# Patient Record
Sex: Male | Born: 1995 | Race: White | Hispanic: No | Marital: Single | Smoking: Former smoker
Health system: Southern US, Community
[De-identification: ages and names within clinical notes are randomized; demographics above are authoritative.]

---
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
## 2017-04-03 ENCOUNTER — Emergency Department (HOSPITAL_BASED_OUTPATIENT_CLINIC_OR_DEPARTMENT_OTHER)
Admission: EM | Admit: 2017-04-03 | Discharge: 2017-04-03 | Disposition: A | Discharge status: Home / Self Care | Payer: Managed Care, Other (non HMO) | Attending: Emergency Medicine | Admitting: Emergency Medicine

## 2017-04-03 ENCOUNTER — Encounter (HOSPITAL_BASED_OUTPATIENT_CLINIC_OR_DEPARTMENT_OTHER): Payer: Self-pay | Admitting: Emergency Medicine

## 2017-04-03 ENCOUNTER — Emergency Department (HOSPITAL_BASED_OUTPATIENT_CLINIC_OR_DEPARTMENT_OTHER): Payer: Managed Care, Other (non HMO)

## 2017-04-03 DIAGNOSIS — Y929 Unspecified place or not applicable: Secondary | ICD-10-CM | POA: Diagnosis not present

## 2017-04-03 DIAGNOSIS — Z79899 Other long term (current) drug therapy: Secondary | ICD-10-CM | POA: Insufficient documentation

## 2017-04-03 DIAGNOSIS — W260XXA Contact with knife, initial encounter: Secondary | ICD-10-CM | POA: Insufficient documentation

## 2017-04-03 DIAGNOSIS — Y999 Unspecified external cause status: Secondary | ICD-10-CM | POA: Insufficient documentation

## 2017-04-03 DIAGNOSIS — Z87891 Personal history of nicotine dependence: Secondary | ICD-10-CM | POA: Insufficient documentation

## 2017-04-03 DIAGNOSIS — Y9389 Activity, other specified: Secondary | ICD-10-CM | POA: Insufficient documentation

## 2017-04-03 DIAGNOSIS — S61412A Laceration without foreign body of left hand, initial encounter: Secondary | ICD-10-CM | POA: Diagnosis not present

## 2017-04-03 DIAGNOSIS — S6982XA Other specified injuries of left wrist, hand and finger(s), initial encounter: Secondary | ICD-10-CM | POA: Diagnosis present

## 2017-04-03 MED ORDER — LIDOCAINE-EPINEPHRINE-TETRACAINE (LET) SOLUTION
3.0000 mL | Freq: Once | NASAL | Status: AC
  Administered 2017-04-03: 3 mL via TOPICAL
  Filled 2017-04-03: qty 3

## 2017-04-03 MED ORDER — LIDOCAINE HCL 1 % IJ SOLN
INTRAMUSCULAR | Status: AC
  Administered 2017-04-03: 20 mL
  Filled 2017-04-03: qty 20

## 2017-04-03 MED ORDER — LIDOCAINE HCL (PF) 1 % IJ SOLN: 30.0000 mL | Freq: Once | INTRAMUSCULAR | Status: DC

## 2017-04-03 NOTE — ED Triage Notes (Signed)
Laceration to left hand by a machete while cleaning brush out of the road this afternoon.

## 2017-04-03 NOTE — ED Provider Notes (Signed)
[facility] Provider [facility]   CSN: [id number] Arrival date & time: 04/03/17  1743     History   Chief Complaint Chief Complaint  Patient presents with  . Extremity Laceration    HPI Colin Conner is a 21 y.o. male.  HPI 21 year old Caucasian male presents to the ED with complaints of laceration to the left hand. Patient states that he was trying to move a tree out of the road today and he used his machete. States a machete hit his left hand and caused a laceration. Patient states his tetanus shot is up-to-date. Denies any associated paresthesias or weakness. Denies any pain. Bleeding controlled. History reviewed. No pertinent past medical history.  There are no active problems to display for this patient.   History reviewed. No pertinent surgical history.     Home Medications    Prior to Admission medications   Medication Sig Start Date End Date Taking? Authorizing Provider  bacitracin ointment Apply 1 application topically 2 (two) times daily. 10/21/16   Everlene Farrier, PA-C  naproxen (NAPROSYN) 250 MG tablet Take 1 tablet (250 mg total) by mouth 2 (two) times daily with a meal. As needed for pain. 10/21/16   Everlene Farrier, PA-C    Family History No family history on file.  Social History Social History  Substance Use Topics  . Smoking status: Former Games developer  . Smokeless tobacco: Never Used  . Alcohol use No     Allergies   Patient has no known allergies.   Review of Systems Review of Systems  Musculoskeletal: Positive for myalgias.  Skin: Positive for wound.  Neurological: Negative for weakness and numbness.     Physical Exam Updated Vital Signs BP 138/72 (BP Location: Left Arm)   Pulse (!) 57   Temp 97.8 F (36.6 C) (Oral)   Resp 16   Ht  (1.88 m)   Wt 65.8 kg (145 lb)   SpO2 100%   BMI 18.62 kg/m   Physical Exam  Constitutional: He appears well-developed and well-nourished. No distress.  HENT:  Head: Normocephalic and  atraumatic.  Eyes: Right eye exhibits no discharge. Left eye exhibits no discharge. No scleral icterus.  Neck: Normal range of motion.  Pulmonary/Chest: No respiratory distress.  Musculoskeletal: Normal range of motion.       Hands: 3 similar laceration to the left thenar eminence of the left hand. Bleeding is controlled.No skin debris noted. Full range of motion the left wrist. Good opposition, abduction, abduction of the left thumb. Patient's hand is cool to touch however good cap refill. Patient states this is baseline for him. Sensation intact in all dermatomes. Good strength in all phalanges.  Neurological: He is alert.  Skin: No pallor.  Psychiatric: His behavior is normal. Judgment and thought content normal.  Nursing note and vitals reviewed.    ED Treatments / Results  Labs (all labs ordered are listed, but only abnormal results are displayed) Labs Reviewed - No data to display  EKG  EKG Interpretation None       Radiology Dg Hand Complete Left  Result Date: 04/03/2017 CLINICAL DATA:  21 year old male status post laceration when cutting down trees. EXAM: LEFT HAND - COMPLETE 3+ VIEW COMPARISON:  None. FINDINGS: Dressing material about the carpometacarpal junction level. Skeletally mature. Bone mineralization is within normal limits. Distal radius and ulna appear intact. Carpal bone alignment and joint spaces are normal. Metacarpals intact. Phalanges intact and normally aligned. No radiopaque foreign body identified. No subcutaneous gas. IMPRESSION:  Negative. Electronically Signed   By: Odessa Fleming M.D.   On: 04/03/2017 18:44    Procedures .Marland KitchenLaceration Repair Date/Time: 04/03/2017 7:27 PM Performed by: Rise Mu Authorized by: Demetrios Loll T   Consent:    Consent obtained:  Verbal   Consent given by:  Patient   Risks discussed:  Infection, need for additional repair, nerve damage, poor wound healing, poor cosmetic result, retained foreign body, tendon  damage, vascular damage and pain   Alternatives discussed:  No treatment Anesthesia (see MAR for exact dosages):    Anesthesia method:  Local infiltration and topical application   Topical anesthetic:  LET   Local anesthetic:  Lidocaine 1% w/o epi Laceration details:    Location:  Hand   Hand location:  L palm   Length (cm):  3   Depth (mm):  10 Pre-procedure details:    Preparation:  Patient was prepped and draped in usual sterile fashion and imaging obtained to evaluate for foreign bodies Exploration:    Hemostasis achieved with:  Direct pressure and LET   Wound exploration: wound explored through full range of motion and entire depth of wound probed and visualized     Wound extent: no foreign bodies/material noted and no underlying fracture noted     Contaminated: no   Treatment:    Area cleansed with:  Saline and Betadine   Amount of cleaning:  Extensive   Irrigation solution:  Sterile water   Irrigation volume:  100   Irrigation method:  Pressure wash   Visualized foreign bodies/material removed: no   Skin repair:    Repair method:  Sutures   Suture size:  4-0   Suture material:  Prolene   Suture technique:  Simple interrupted   Number of sutures:  9 Approximation:    Approximation:  Close   Vermilion border: well-aligned   Post-procedure details:    Dressing:  Bulky dressing and splint for protection   Patient tolerance of procedure:  Tolerated well, no immediate complications   (including critical care time)  Medications Ordered in ED Medications  lidocaine-EPINEPHrine-tetracaine (LET) solution (not administered)  lidocaine (PF) (XYLOCAINE) 1 % injection 30 mL (not administered)     Initial Impression / Assessment and Plan / ED Course  I have reviewed the triage vital signs and the nursing notes.  Pertinent labs & imaging results that were available during my care of the patient were reviewed by me and considered in my medical decision making (see chart for  details).     Patient presents with laceration to left hand from machete. Wound is clean without any significant debris. Patient is neurovascularly intact. Patient's hand is cold however this is baseline per patient and bilatearlly. Cap refill is normal. Tdap utd.Pressure irrigation performed. Laceration occurred < 8 hours prior to repair which was well tolerated. Pt has no co morbidities to effect normal wound healing. X-ray shows no foreign body. Wound was extensively cleaned. Discussed suture home care w pt and answered questions. Patient placed in thumb spica to prevent mobilization of the thumb. Pt to f-u for wound check and suture removal in 7 days. Pt is hemodynamically stable w no complaints prior to dc.     Final Clinical Impressions(s) / ED Diagnoses   Final diagnoses:  Laceration of left hand without foreign body, initial encounter    New Prescriptions New Prescriptions   No medications on file     Wallace Keller 04/03/17 1920    Rise Mu,  PA-C 04/03/17 1921    Rise Mu, PA-C 04/03/17 Edsel Petrin, MD 04/04/17 [phone]

## 2017-04-03 NOTE — Discharge Instructions (Signed)
WOUND CARE °Please have your stitches/staples removed in 7-10 days or sooner if you have concerns. You may do this at any available urgent care or at your primary care doctor's office. ° Keep area clean and dry for 24 hours. Do not remove °bandage, if applied. ° After 24 hours, remove bandage and wash wound °gently with mild soap and warm water. Reapply °a new bandage after cleaning wound, if directed. ° Continue daily cleansing with soap and water until °stitches/staples are removed. ° Do not apply any ointments or creams to the wound °while stitches/staples are in place, as this may cause °delayed healing. ° Seek medical careif you experience any of the following °signs of infection: Swelling, redness, pus drainage, °streaking, fever >101.0 F ° Seek care if you experience excessive bleeding °that does not stop after 15-20 minutes of constant, firm °pressure. °  °

## 2017-04-03 NOTE — ED Notes (Signed)
ED Provider at bedside. 

## 2018-01-13 IMAGING — CR DG TIBIA/FIBULA 2V*L*
2 series · 2 of 2 positions shown · non-contrast
Comparison: None.

CLINICAL DATA: Trauma to the anterior left tibia region.

EXAM:
LEFT TIBIA AND FIBULA - 2 VIEW

[x tib-fib ap left]
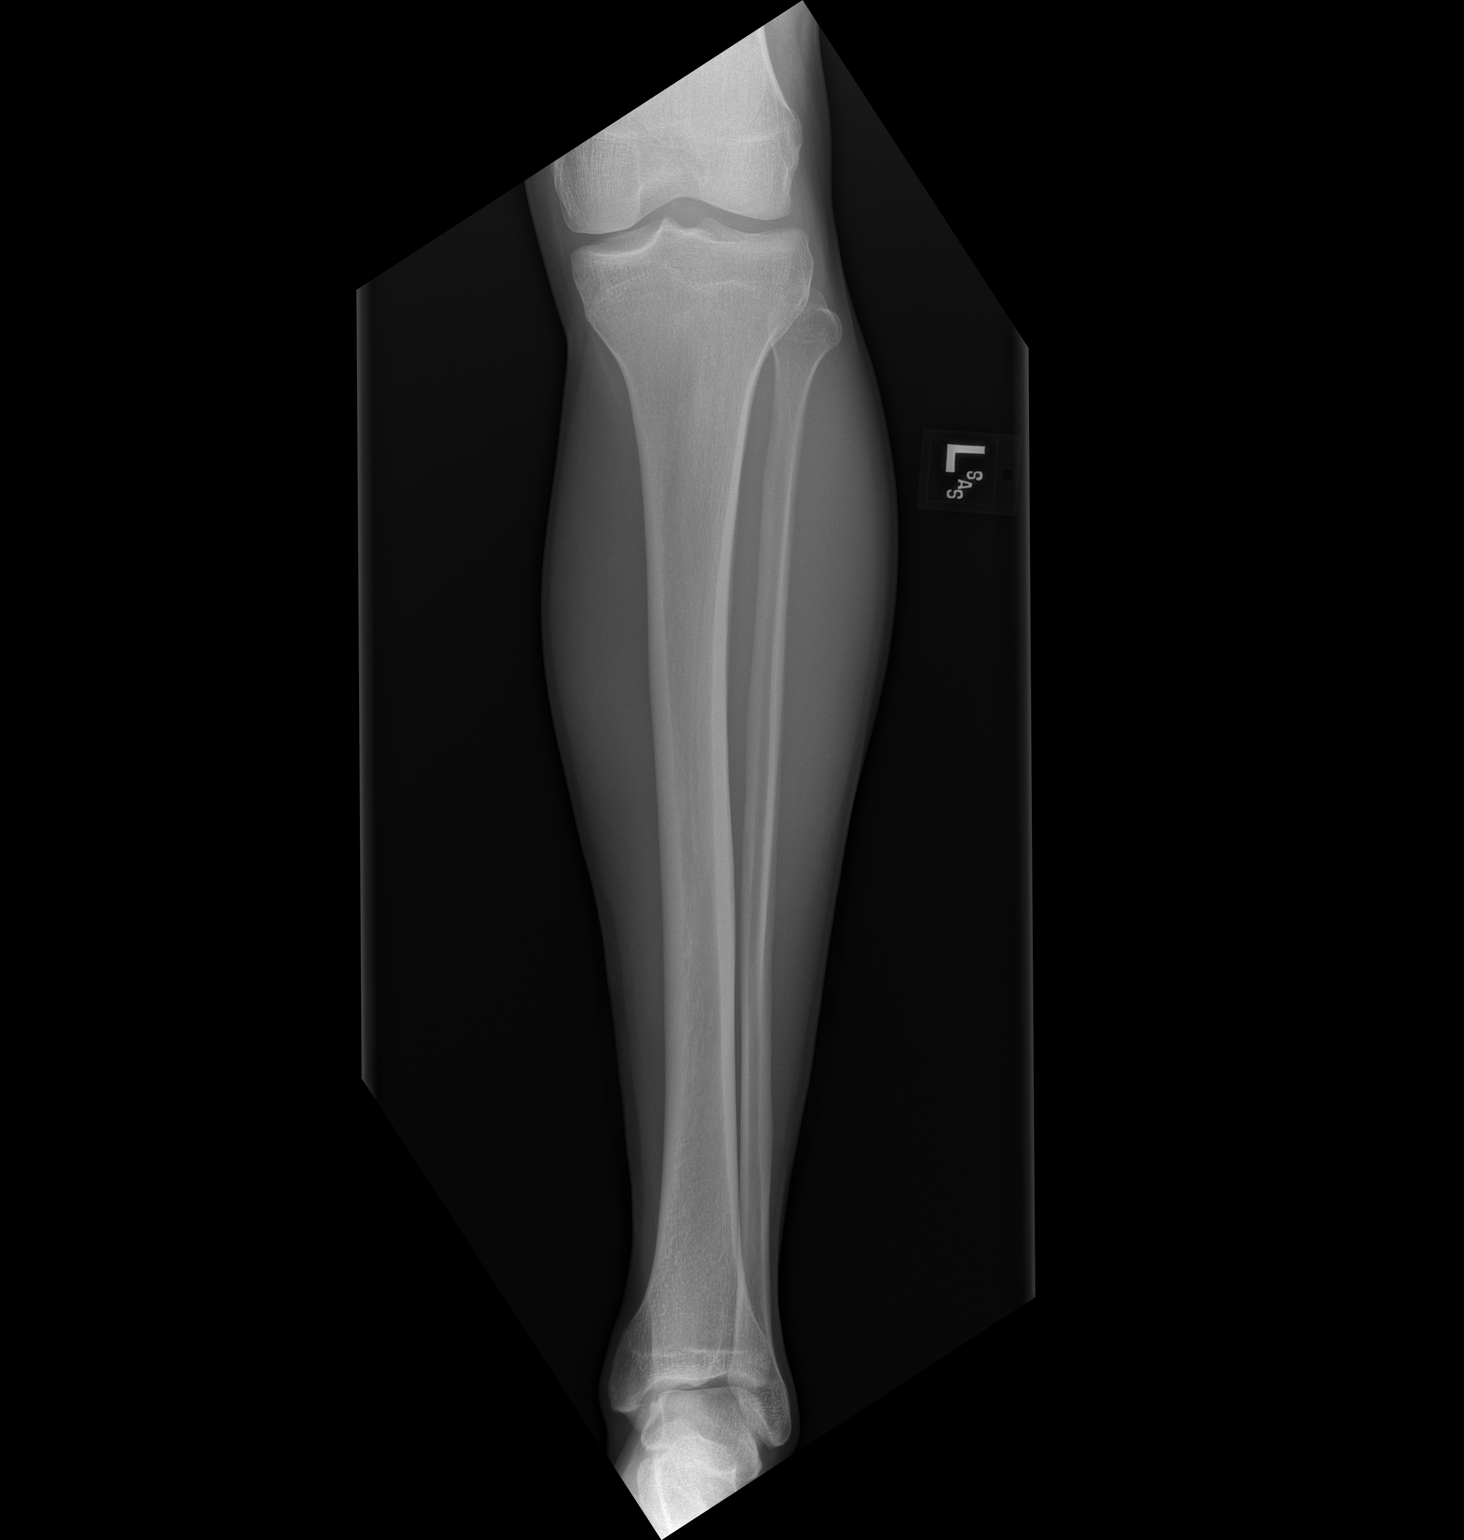

[x tib-fib lat left]
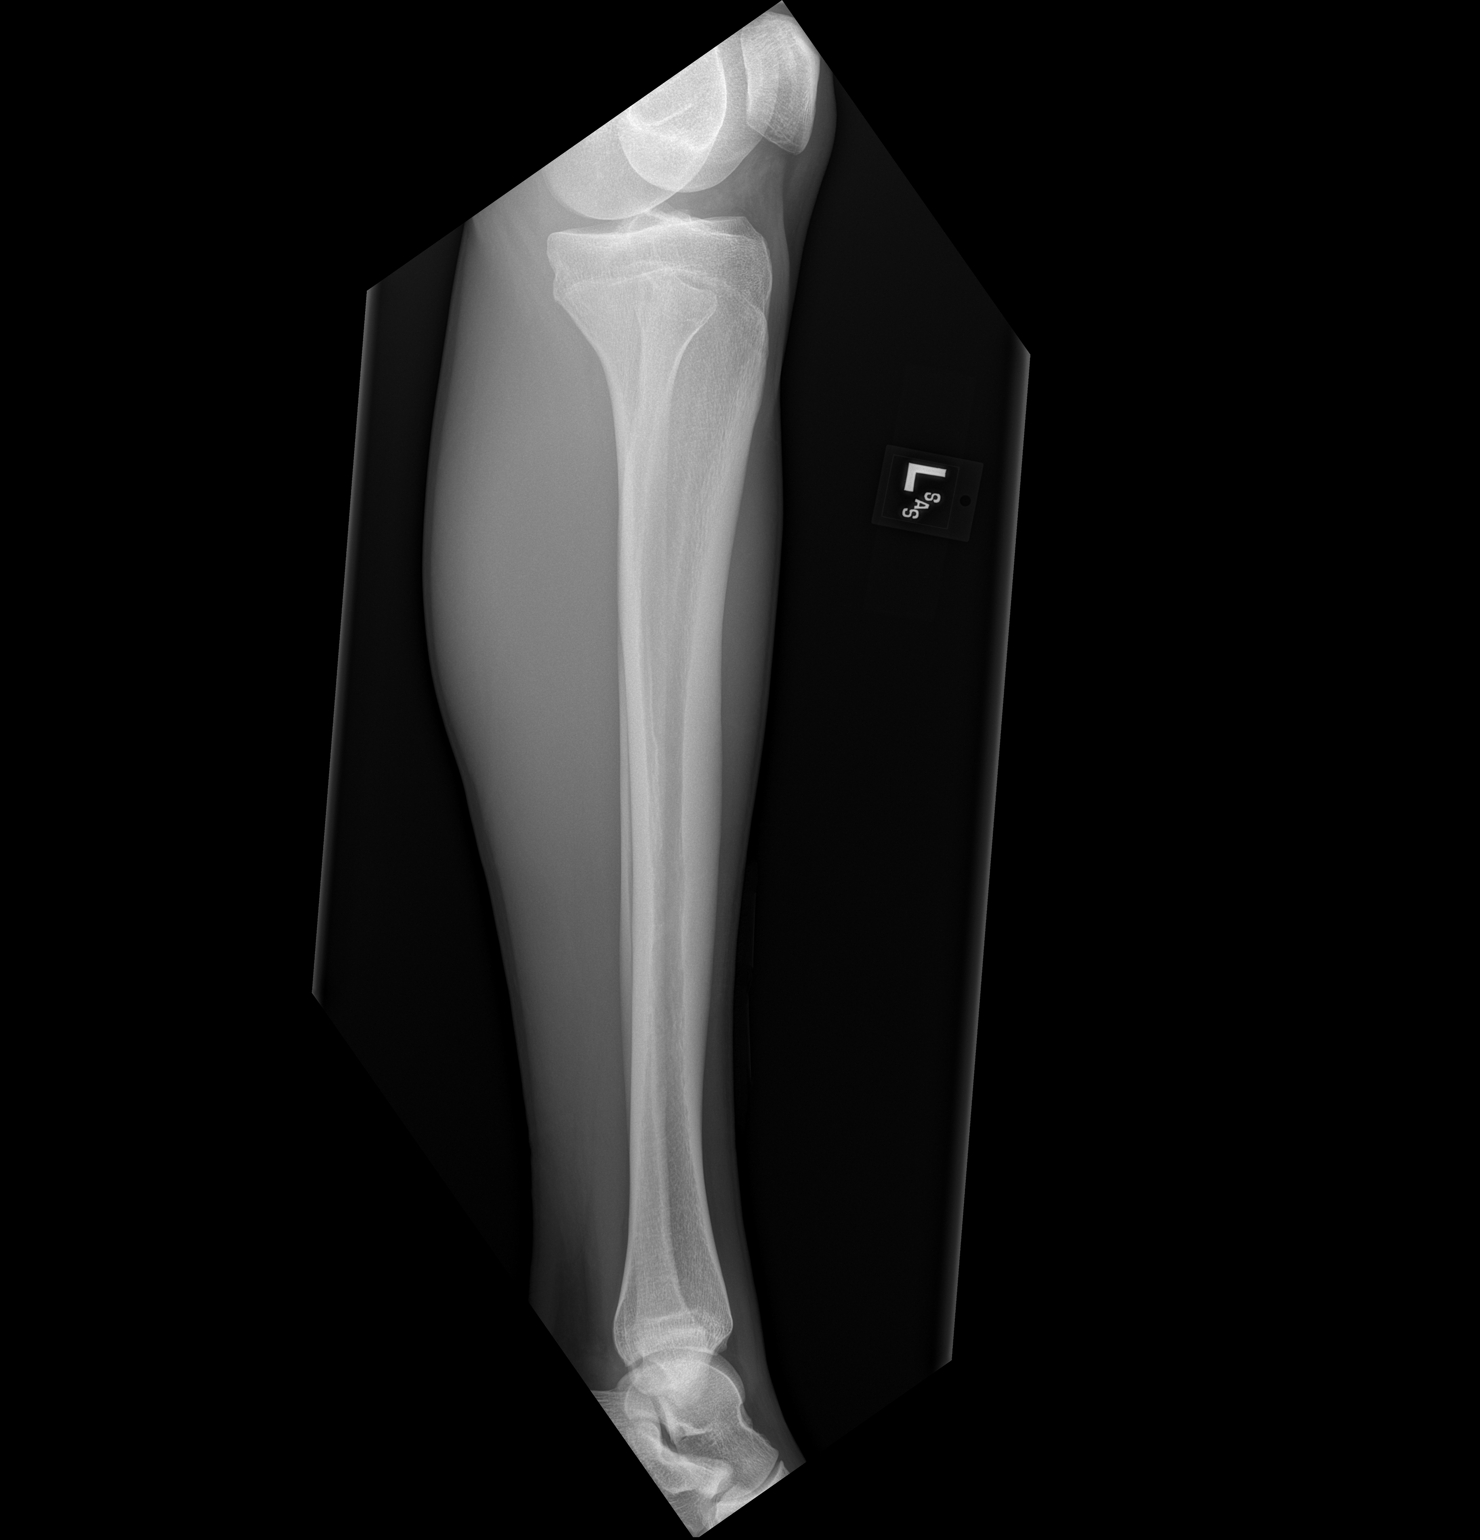

[2 of 2 positions shown; findings below may reference images not displayed]

FINDINGS: There is no evidence of fracture or other focal bone lesions. Soft
tissues are unremarkable.
IMPRESSION: No fracture.  No radiopaque foreign body.

## 2018-06-26 IMAGING — CR DG HAND COMPLETE 3+V*L*
3 series · 3 of 3 positions shown · non-contrast
Comparison: None.

CLINICAL DATA: 21-year-old male status post laceration when cutting
down trees.

EXAM:
LEFT HAND - COMPLETE 3+ VIEW

[x hand pa left]
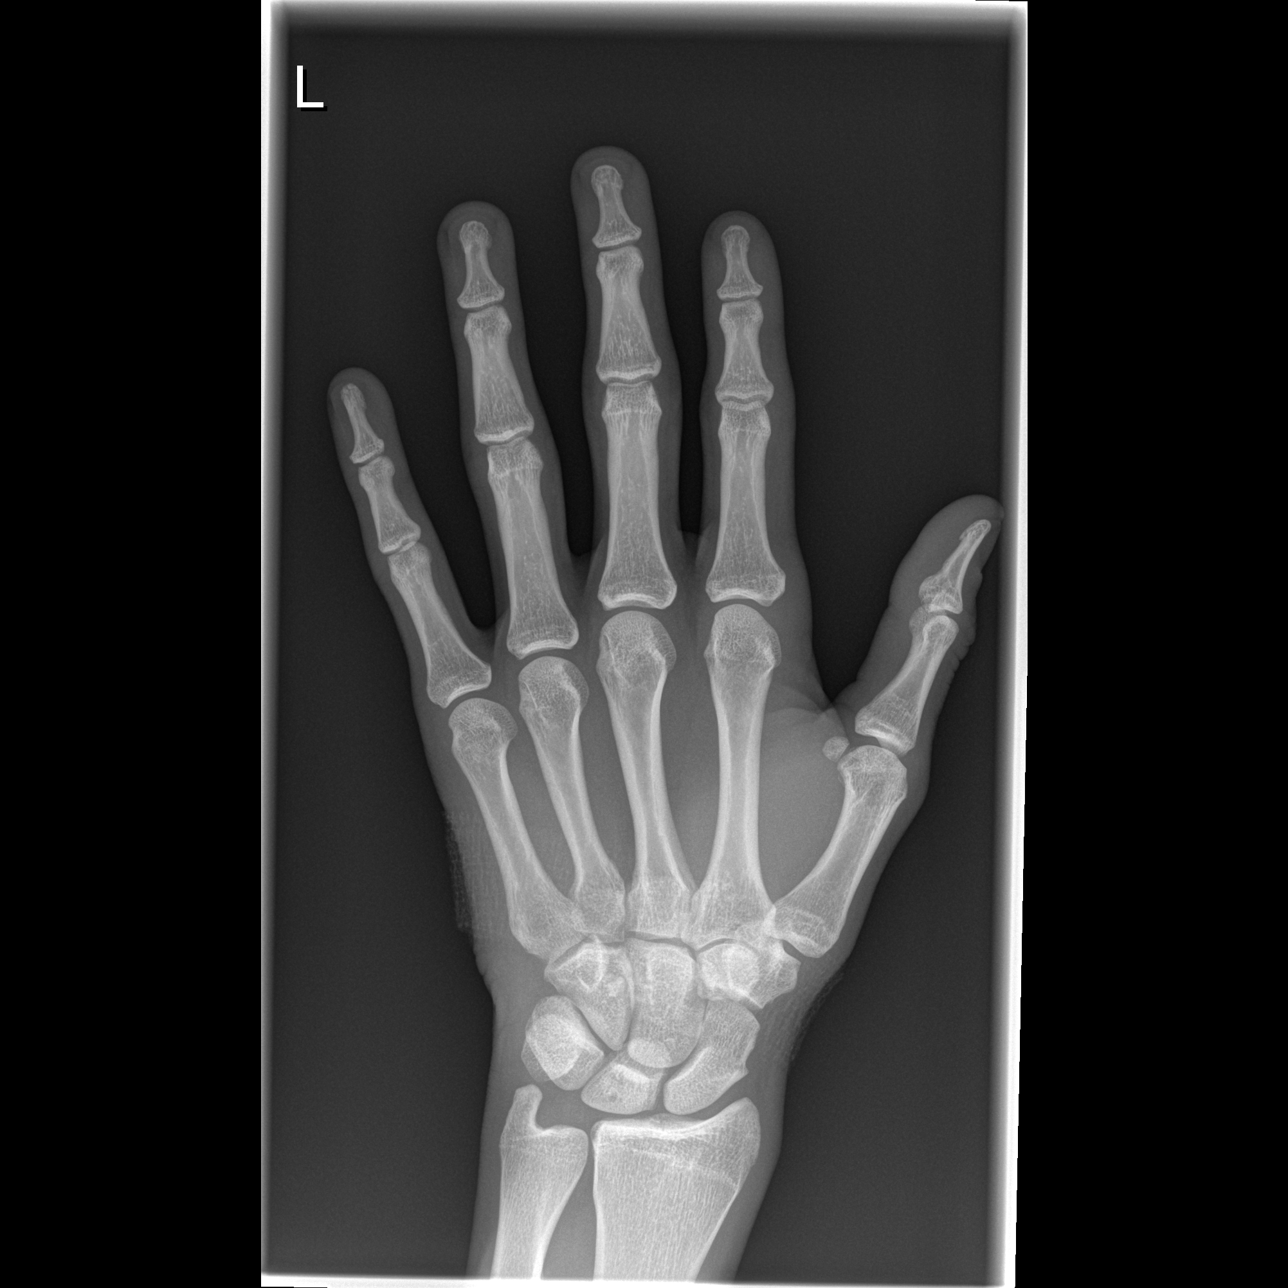

[x hand oblique left]
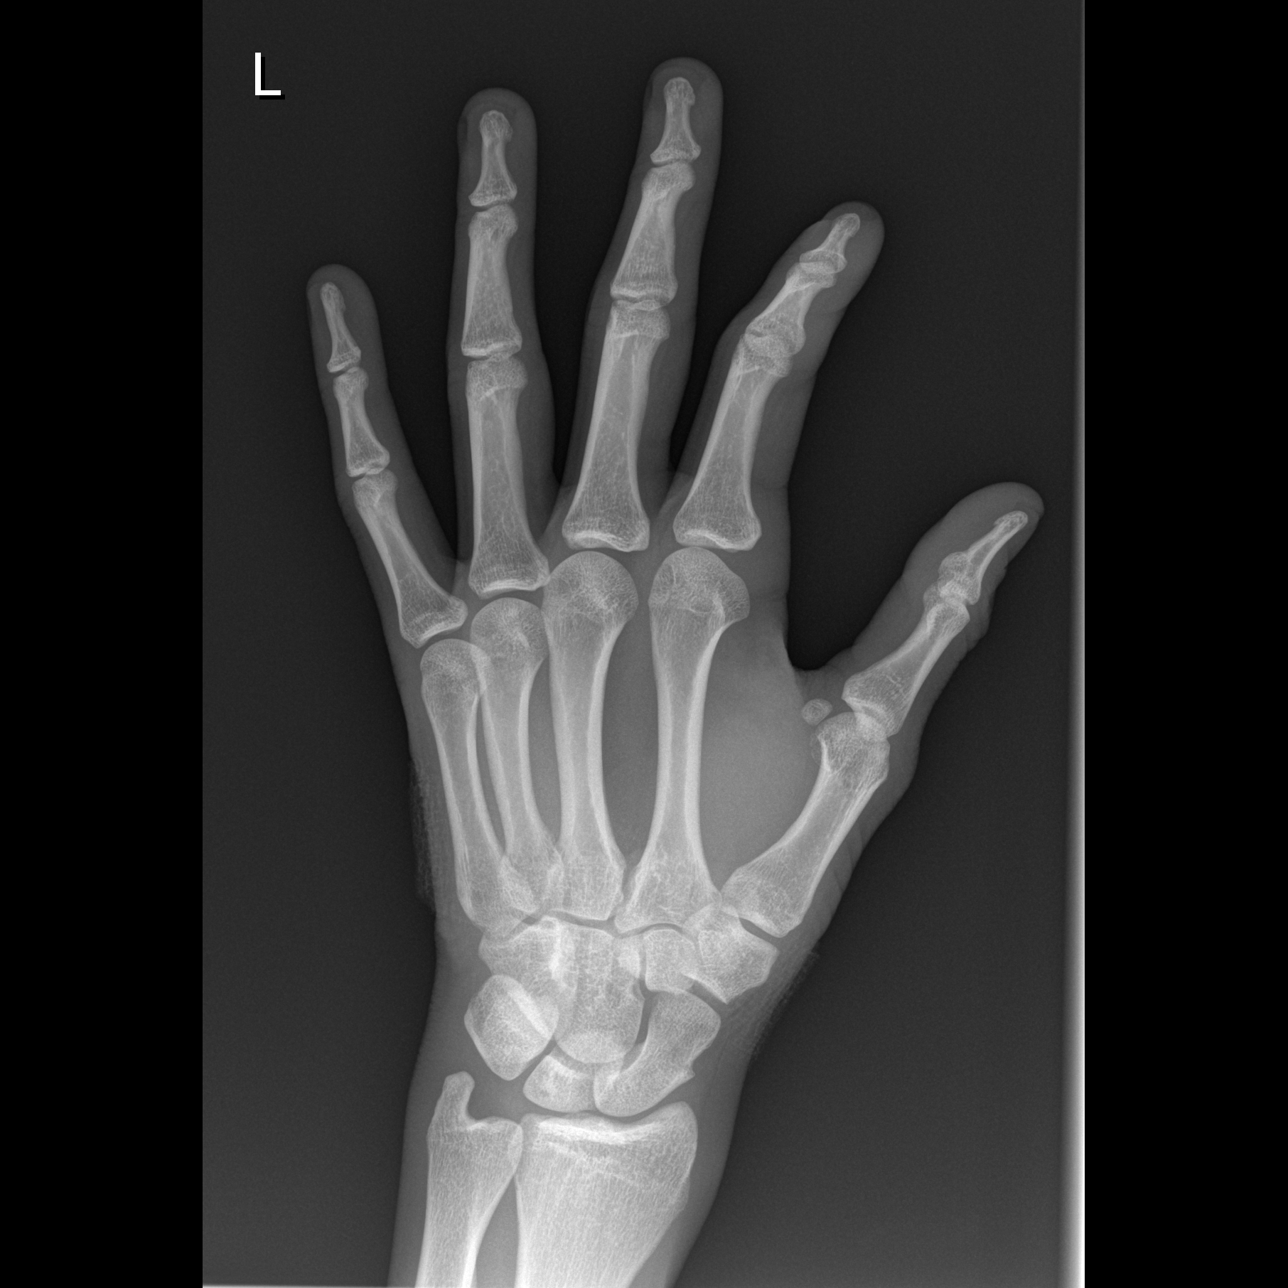

[x hand lat left]
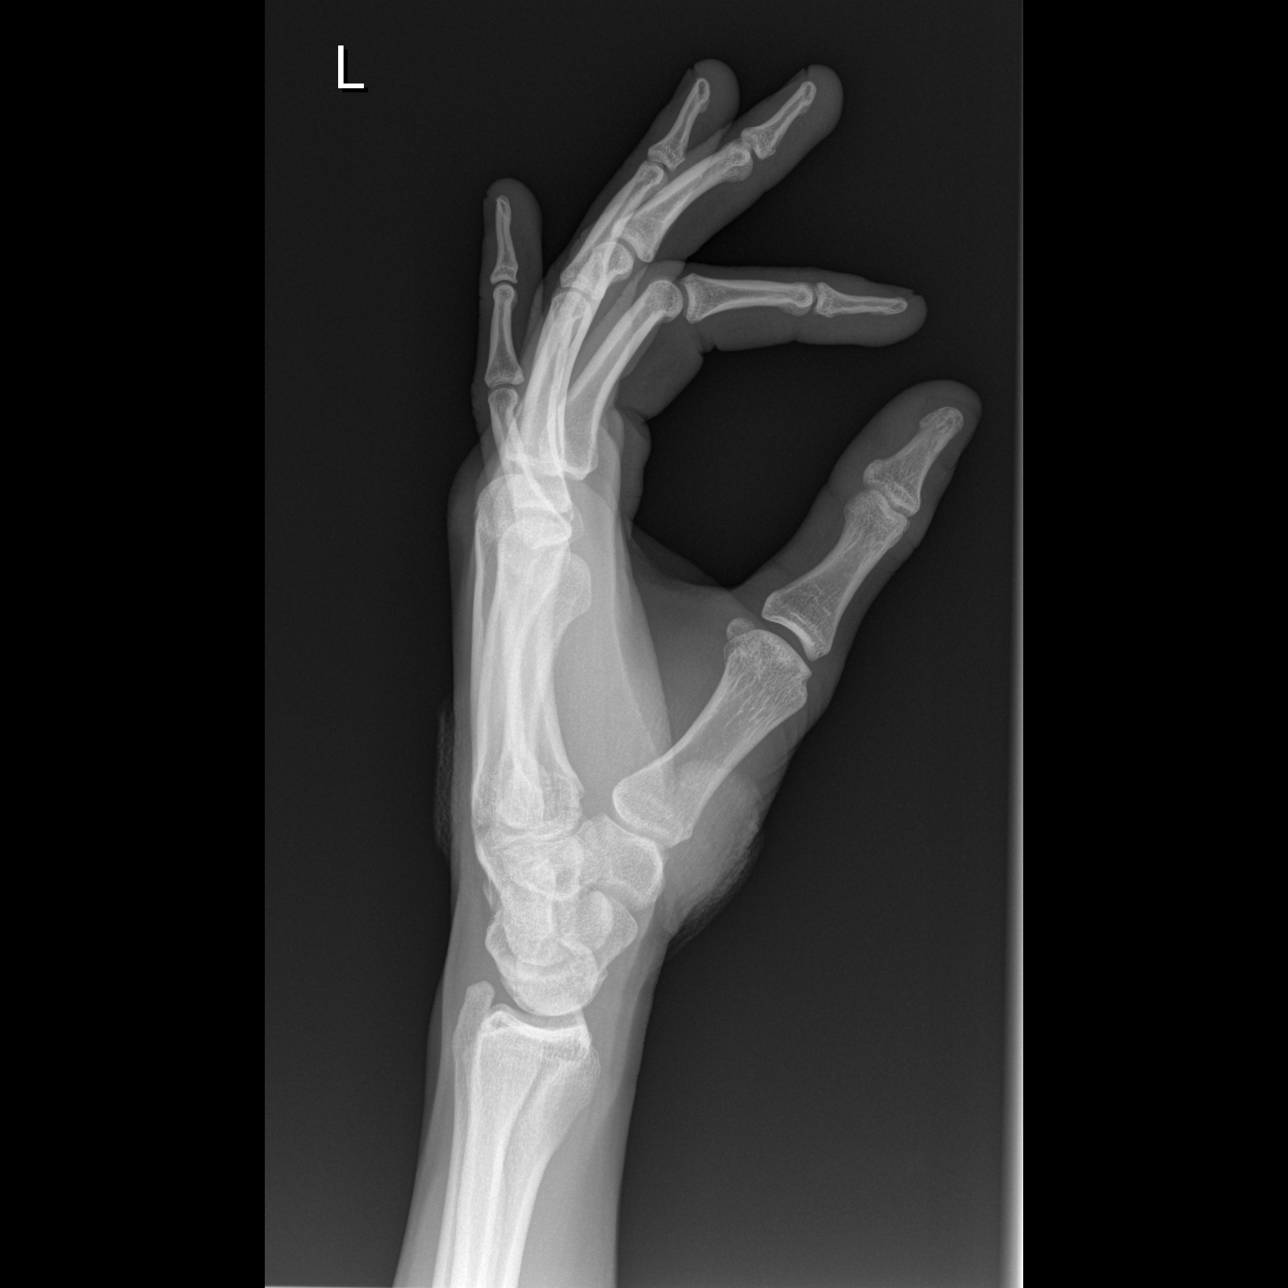

[3 of 3 positions shown; findings below may reference images not displayed]

FINDINGS: Dressing material about the carpometacarpal junction level.
Skeletally mature. Bone mineralization is within normal limits.
Distal radius and ulna appear intact. Carpal bone alignment and
joint spaces are normal. Metacarpals intact. Phalanges intact and
normally aligned. No radiopaque foreign body identified. No
subcutaneous gas.
IMPRESSION: Negative.
